# Patient Record
Sex: Female | Born: 2000 | Race: White | Hispanic: No | Marital: Single | Smoking: Never smoker
Health system: Southern US, Community
[De-identification: ages and names within clinical notes are randomized; demographics above are authoritative.]

## PROBLEM LIST (undated history)

## (undated) ENCOUNTER — Ambulatory Visit: Admission: EM | Payer: Self-pay

---
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
## 2019-07-11 IMAGING — DX DG ANKLE COMPLETE 3+V*R*
3 series · 3 of 3 positions shown · non-contrast
Comparison: None.

CLINICAL DATA: Right ankle pain and swelling following rugby injury
yesterday.

EXAM:
RIGHT ANKLE - COMPLETE 3+ VIEW

[ankle ap]
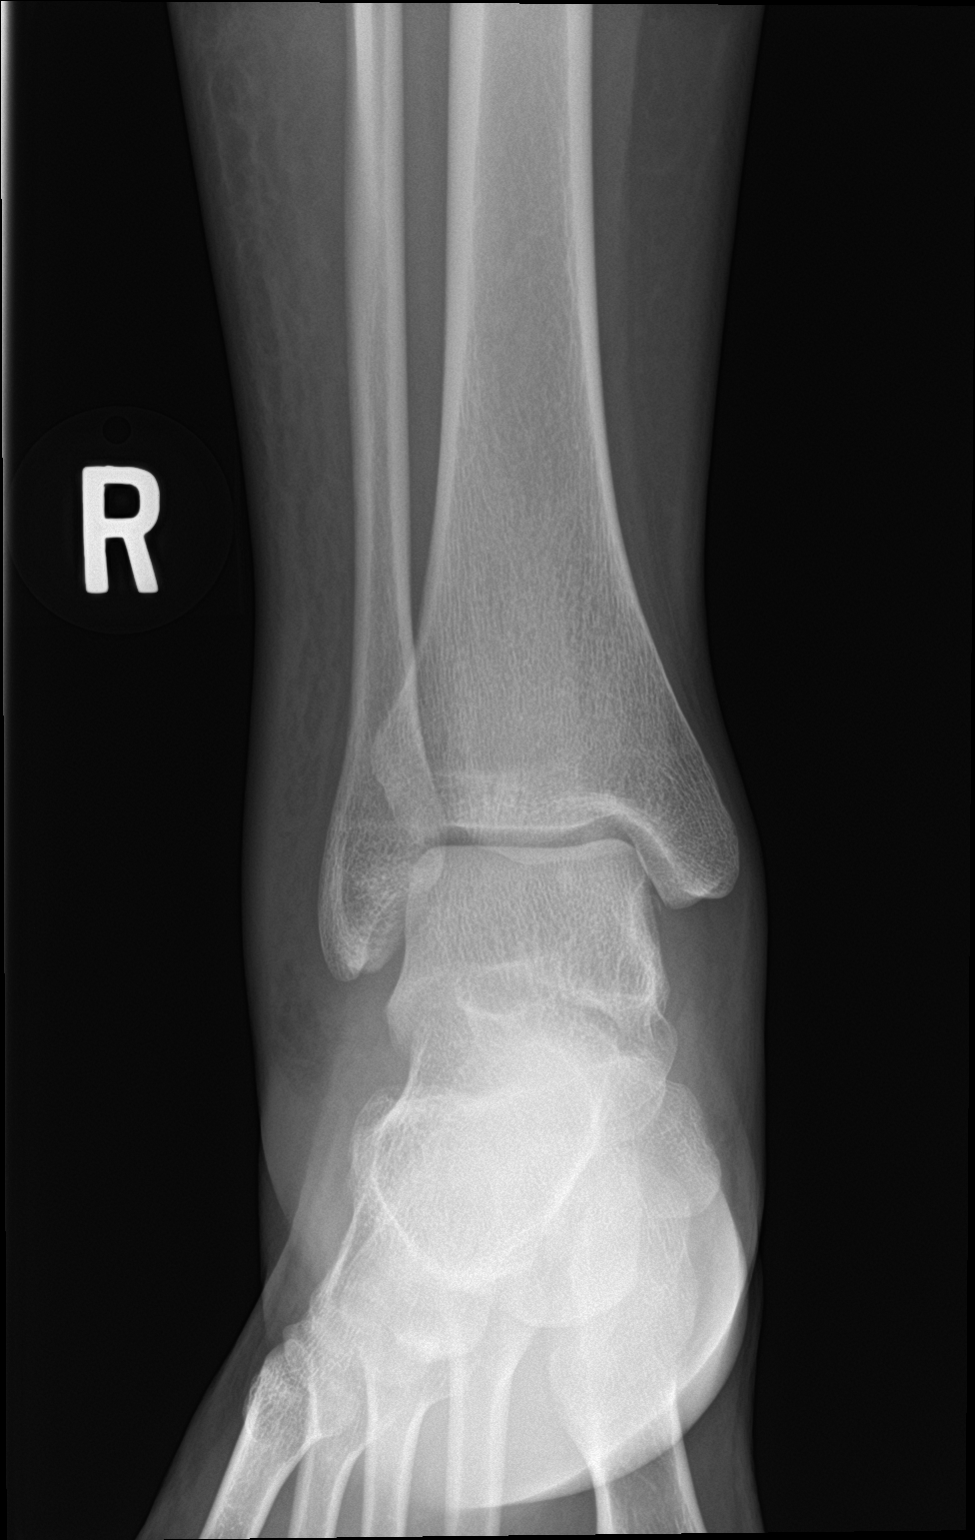

[ankle obl]
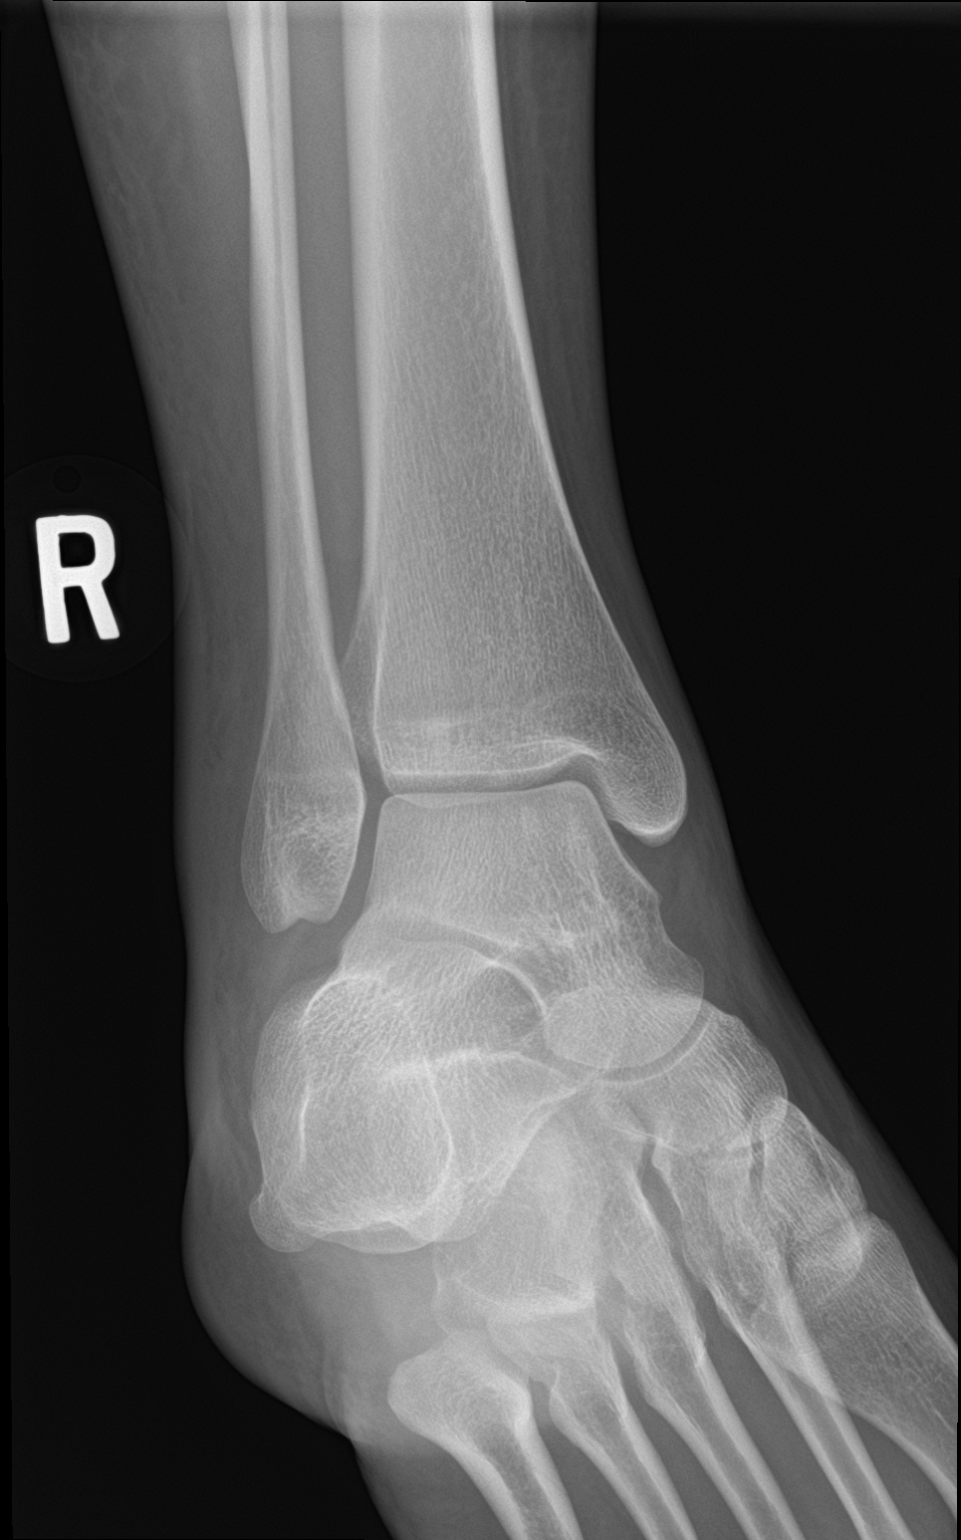

[ankle lat]
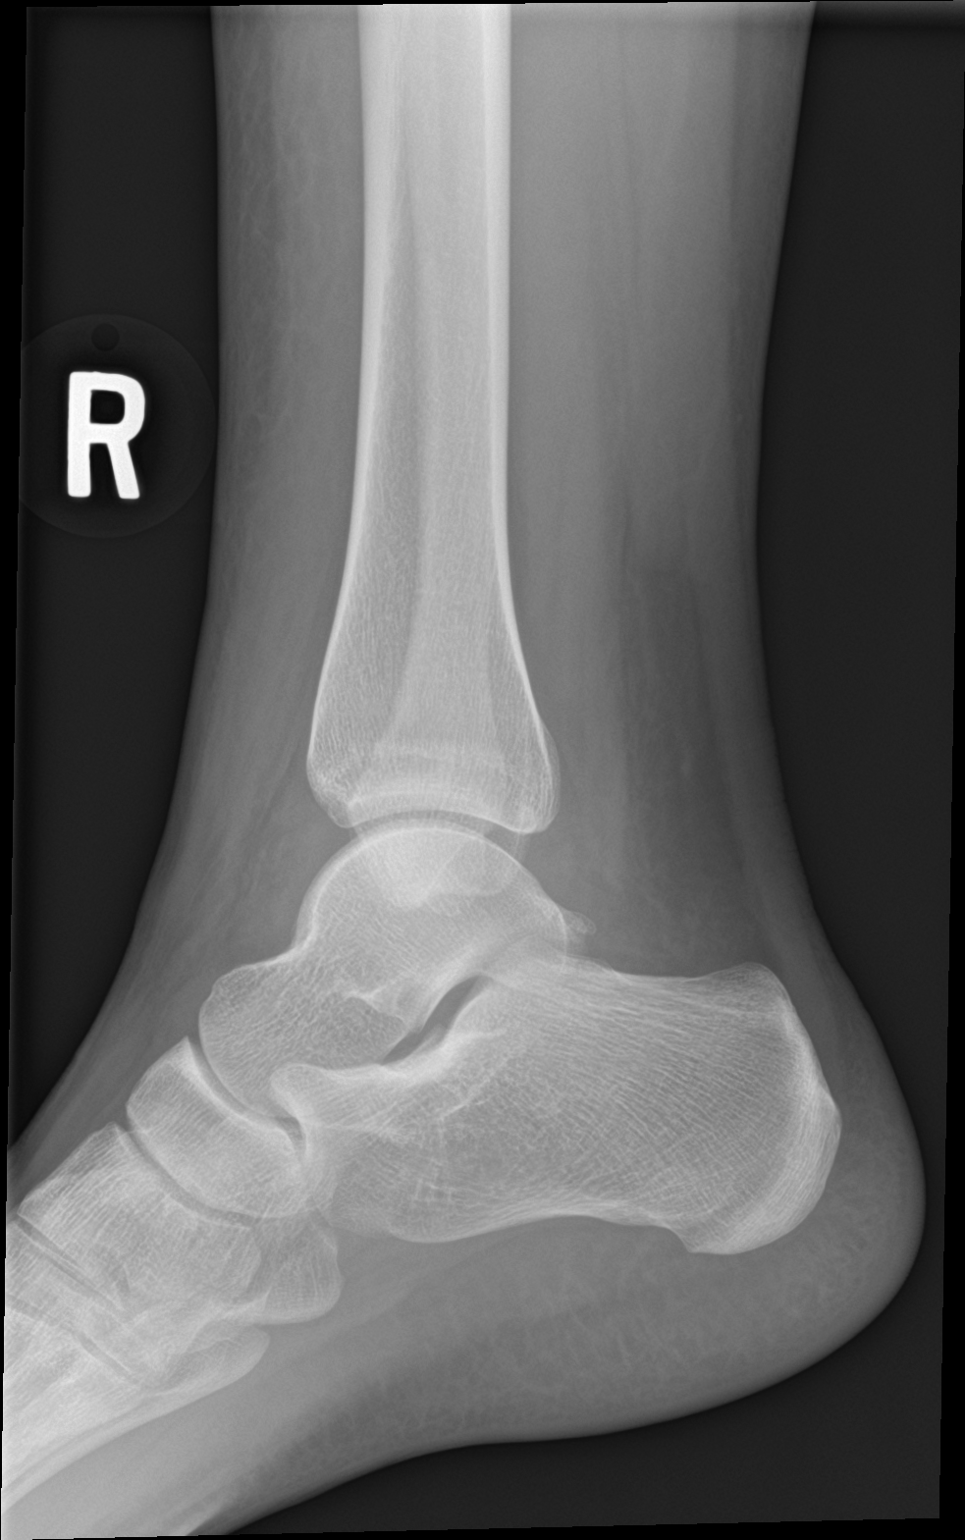

[3 of 3 positions shown; findings below may reference images not displayed]

FINDINGS: The mineralization and alignment are normal. There is no evidence of
acute fracture or dislocation. There is prominent soft tissue
swelling anteriorly and laterally. There is an ankle joint effusion.
IMPRESSION: No evidence of acute osseous injury. Lateral soft tissue swelling
and ankle joint effusion noted.

## 2022-10-18 ENCOUNTER — Other Ambulatory Visit: Payer: Self-pay

## 2022-10-18 ENCOUNTER — Encounter: Payer: Self-pay | Admitting: Emergency Medicine

## 2022-10-18 ENCOUNTER — Ambulatory Visit: Payer: 59

## 2022-10-18 ENCOUNTER — Ambulatory Visit
Admission: EM | Admit: 2022-10-18 | Discharge: 2022-10-18 | Disposition: A | Discharge status: Home / Self Care | Payer: 59

## 2022-10-18 DIAGNOSIS — M79671 Pain in right foot: Secondary | ICD-10-CM

## 2022-10-18 MED ORDER — IBUPROFEN 800 MG PO TABS: 800.0000 mg | ORAL_TABLET | Freq: Three times a day (TID) | ORAL | 0 refills | Status: AC | PRN

## 2022-10-18 NOTE — Discharge Instructions (Signed)
Ice, Epsom salt soaks, supportive shoes, ibuprofen as needed.  We will call if anything comes back abnormal on the x-ray.

## 2022-10-18 NOTE — ED Triage Notes (Signed)
Pt reports right foot pain intermittently for last several weeks. Pt denies any known injury. Gait steady.

## 2022-10-20 NOTE — ED Provider Notes (Signed)
[facility]    CSN: [id number] Arrival date & time: 10/18/22  1330      History   Chief Complaint Chief Complaint  Patient presents with   Foot Pain    HPI Betty Gay is a 22 y.o. female.   Presenting today with several week history of right foot pain at [employer], mainly with weight bearing. Denies swelling, redness, injury, skin changes, decreased ROM. So far not trying anything OTC for sxs apart from wearing bearfoot shoes when able as these seem to help.     History reviewed. No pertinent past medical history.  There are no problems to display for this patient.   History reviewed. No pertinent surgical history.  OB History   No obstetric history on file.      Home Medications    Prior to Admission medications   Medication Sig Start Date End Date Taking? Authorizing Provider  testosterone cypionate (DEPOTESTOTERONE CYPIONATE) 100 MG/ML injection Inject into the muscle every 7 (seven) days. For IM use only   Yes [provider]  ibuprofen (ADVIL) 800 MG tablet Take 1 tablet (800 mg total) by mouth every 8 (eight) hours as needed. 10/18/22   Particia Nearing, PA-C    Family History History reviewed. No pertinent family history.  Social History Social History   Tobacco Use   Smoking status: Never   Smokeless tobacco: Never  Substance Use Topics   Alcohol use: Not Currently     Allergies   Patient has no known allergies.   Review of Systems Review of Systems PER HPI  Physical Exam Triage Vital Signs ED Triage Vitals  Encounter Vitals Group     BP 10/18/22 1358 129/85     Systolic BP Percentile --      Diastolic BP Percentile --      Pulse Rate 10/18/22 1358 61     Resp 10/18/22 1358 20     Temp 10/18/22 1358 98.5 F (36.9 C)     Temp Source 10/18/22 1358 Oral     SpO2 10/18/22 1358 98 %     Weight --      Height --      Head Circumference --      Peak Flow --      Pain Score 10/18/22 1355 2     Pain Loc --       Pain Education --      Exclude from Growth Chart --    No data found.  Updated Vital Signs BP 129/85 (BP Location: Right Arm)   Pulse 61   Temp 98.5 F (36.9 C) (Oral)   Resp 20   LMP 09/13/2022 (Approximate) Comment: pt reports started taking testerone weekly.  SpO2 98%   Visual Acuity Right Eye Distance:   Left Eye Distance:   Bilateral Distance:    Right Eye Near:   Left Eye Near:    Bilateral Near:     Physical Exam Vitals and nursing note reviewed.  Constitutional:      Appearance: Normal appearance.  HENT:     Head: Atraumatic.     Mouth/Throat:     Mouth: Mucous membranes are moist.  Eyes:     Extraocular Movements: Extraocular movements intact.     Conjunctiva/sclera: Conjunctivae normal.  Cardiovascular:     Rate and Rhythm: Normal rate.  Pulmonary:     Effort: Pulmonary effort is normal.  Musculoskeletal:        General: Normal range of motion.  Cervical back: Normal range of motion and neck supple.  Skin:    General: Skin is warm and dry.  Neurological:     Mental Status: She is alert and oriented to person, place, and time.     Comments: RLE neurovascularly intact  Psychiatric:        Mood and Affect: Mood normal.        Thought Content: Thought content normal.      UC Treatments / Results  Labs (all labs ordered are listed, but only abnormal results are displayed) Labs Reviewed - No data to display  EKG   Radiology No results found.  Procedures Procedures (including critical care time)  Medications Ordered in UC Medications - No data to display  Initial Impression / Assessment and Plan / UC Course  I have reviewed the triage vital signs and the nursing notes.  Pertinent labs & imaging results that were available during my care of the patient were reviewed by me and considered in my medical decision making (see chart for details).     Right foot x-ray neg for acute bony abnormality, discussed ibuprofen and RICE protocol,  return for worsening sxs  Final Clinical Impressions(s) / UC Diagnoses   Final diagnoses:  Right foot pain     Discharge Instructions      Ice, Epsom salt soaks, supportive shoes, ibuprofen as needed.  We will call if anything comes back abnormal on the x-ray.    ED Prescriptions     Medication Sig Dispense Auth. Provider   ibuprofen (ADVIL) 800 MG tablet Take 1 tablet (800 mg total) by mouth every 8 (eight) hours as needed. 21 tablet Particia Nearing, [place]      PDMP not reviewed this encounter.   Particia Nearing, [place] 10/20/22 2217
